# Patient Record
Sex: Male | Born: 1970
Health system: Southern US, Community
[De-identification: ages and names within clinical notes are randomized; demographics above are authoritative.]

## PROBLEM LIST (undated history)

## (undated) DIAGNOSIS — Z8619 Personal history of other infectious and parasitic diseases: Secondary | ICD-10-CM

## (undated) DIAGNOSIS — E1165 Type 2 diabetes mellitus with hyperglycemia: Secondary | ICD-10-CM

## (undated) DIAGNOSIS — Z87442 Personal history of urinary calculi: Secondary | ICD-10-CM

## (undated) DIAGNOSIS — E669 Obesity, unspecified: Secondary | ICD-10-CM

## (undated) DIAGNOSIS — L409 Psoriasis, unspecified: Secondary | ICD-10-CM

## (undated) DIAGNOSIS — Z86010 Personal history of colonic polyps: Secondary | ICD-10-CM

## (undated) DIAGNOSIS — N200 Calculus of kidney: Secondary | ICD-10-CM

## (undated) DIAGNOSIS — E785 Hyperlipidemia, unspecified: Secondary | ICD-10-CM

## (undated) DIAGNOSIS — I10 Essential (primary) hypertension: Secondary | ICD-10-CM

## (undated) HISTORY — DX: Hyperlipidemia, unspecified: E78.5

## (undated) HISTORY — DX: Calculus of kidney: N20.0

## (undated) HISTORY — DX: Essential (primary) hypertension: I10

## (undated) HISTORY — DX: Psoriasis, unspecified: L40.9

## (undated) HISTORY — DX: Personal history of colonic polyps: Z86.010

## (undated) HISTORY — DX: Personal history of urinary calculi: Z87.442

## (undated) HISTORY — DX: Obesity, unspecified: E66.9

## (undated) HISTORY — DX: Type 2 diabetes mellitus with hyperglycemia: E11.65

## (undated) HISTORY — DX: Personal history of other infectious and parasitic diseases: Z86.19

---

## 1994-07-12 DIAGNOSIS — Z87442 Personal history of urinary calculi: Secondary | ICD-10-CM

## 1994-07-12 HISTORY — DX: Personal history of urinary calculi: Z87.442

## 2008-07-12 HISTORY — PX: COLONOSCOPY: SHX174

## 2015-03-21 ENCOUNTER — Ambulatory Visit (INDEPENDENT_AMBULATORY_CARE_PROVIDER_SITE_OTHER): Payer: BLUE CROSS/BLUE SHIELD | Admitting: Family Medicine

## 2015-03-21 ENCOUNTER — Encounter: Payer: Self-pay | Admitting: Family Medicine

## 2015-03-21 VITALS — BP 150/100 | HR 72 | Temp 98.1°F | Ht 71.75 in | Wt 255.8 lb

## 2015-03-21 DIAGNOSIS — R030 Elevated blood-pressure reading, without diagnosis of hypertension: Secondary | ICD-10-CM | POA: Insufficient documentation

## 2015-03-21 DIAGNOSIS — Z0000 Encounter for general adult medical examination without abnormal findings: Secondary | ICD-10-CM | POA: Insufficient documentation

## 2015-03-21 DIAGNOSIS — E669 Obesity, unspecified: Secondary | ICD-10-CM | POA: Insufficient documentation

## 2015-03-21 DIAGNOSIS — IMO0001 Reserved for inherently not codable concepts without codable children: Secondary | ICD-10-CM

## 2015-03-21 DIAGNOSIS — L409 Psoriasis, unspecified: Secondary | ICD-10-CM

## 2015-03-21 HISTORY — DX: Obesity, unspecified: E66.9

## 2015-03-21 MED ORDER — CLOBETASOL PROPIONATE 0.05 % EX CREA: 1.0000 "application " | TOPICAL_CREAM | Freq: Two times a day (BID) | CUTANEOUS | Status: DC

## 2015-03-21 NOTE — Progress Notes (Signed)
BP 150/100 mmHg  Pulse 72  Temp(Src) 98.1 F (36.7 C) (Oral)  Ht 5' 11.75" (1.822 m)  Wt 255 lb 12 oz (116.007 kg)  BMI 34.95 kg/m2   CC: new pt to establish care  Subjective:    Patient ID: Dennis Velasquez, male    DOB: 07-09-1971, 44 y.o.   MRN: [id number]  HPI: Dennis Velasquez is a 44 y.o. male presenting on 03/21/2015 for Establish Care   Psoriasis in legs - controls with lotions and tar. Requests steroid cream which has helped. Also some behind ears - uses nizoral cream for this.   No h/o hypertension  Preventative: Colon cancer screening - at [facility] for fmhx overdue for f/u (~2010).  Flu declines Tdap 2010 Seat belt use discussed Sunscreen use discussed. No changing moles on skin.   Lives with wife and son Occ: Real Estate investment/development Edu: college Activity: some swimming Diet: good water, fruits/vegetables daily  Relevant past medical, surgical, family and social history reviewed and updated as indicated. Interim medical history since our last visit reviewed. Allergies and medications reviewed and updated. No current outpatient prescriptions on file prior to visit.   No current facility-administered medications on file prior to visit.    Review of Systems  Constitutional: Negative for fever, chills, activity change, appetite change, fatigue and unexpected weight change.  HENT: Negative for hearing loss.   Eyes: Negative for visual disturbance.  Respiratory: Negative for cough, chest tightness, shortness of breath and wheezing.   Cardiovascular: Negative for chest pain, palpitations and leg swelling.  Gastrointestinal: Negative for nausea, vomiting, abdominal pain, diarrhea, constipation, blood in stool and abdominal distention.  Genitourinary: Negative for hematuria and difficulty urinating.  Musculoskeletal: Negative for myalgias, arthralgias and neck pain.  Skin: Negative for rash.  Neurological: Negative for dizziness, seizures, syncope and headaches.    Hematological: Negative for adenopathy. Does not bruise/bleed easily.  Psychiatric/Behavioral: Negative for dysphoric mood. The patient is not nervous/anxious.    Per HPI unless specifically indicated above     Objective:    BP 150/100 mmHg  Pulse 72  Temp(Src) 98.1 F (36.7 C) (Oral)  Ht 5' 11.75" (1.822 m)  Wt 255 lb 12 oz (116.007 kg)  BMI 34.95 kg/m2  Wt Readings from Last 3 Encounters:  03/21/15 255 lb 12 oz (116.007 kg)    Physical Exam  Constitutional: He is oriented to person, place, and time. He appears well-developed and well-nourished. No distress.  HENT:  Head: Normocephalic and atraumatic.  Right Ear: Hearing, tympanic membrane, external ear and ear canal normal.  Left Ear: Hearing, tympanic membrane, external ear and ear canal normal.  Nose: Nose normal.  Mouth/Throat: Uvula is midline, oropharynx is clear and moist and mucous membranes are normal. No oropharyngeal exudate, posterior oropharyngeal edema or posterior oropharyngeal erythema.  Eyes: Conjunctivae and EOM are normal. Pupils are equal, round, and reactive to light. No scleral icterus.  Neck: Normal range of motion. Neck supple. No thyromegaly present.  Cardiovascular: Normal rate, regular rhythm, normal heart sounds and intact distal pulses.   No murmur heard. Pulses:      Radial pulses are 2+ on the right side, and 2+ on the left side.  Pulmonary/Chest: Effort normal and breath sounds normal. No respiratory distress. He has no wheezes. He has no rales.  Abdominal: Soft. Bowel sounds are normal. He exhibits no distension and no mass. There is no tenderness. There is no rebound and no guarding.  Musculoskeletal: Normal range of motion. He exhibits  no edema.  Lymphadenopathy:    He has no cervical adenopathy.  Neurological: He is alert and oriented to person, place, and time.  CN grossly intact, station and gait intact  Skin: Skin is warm and dry. No rash noted.  Psychiatric: He has a normal mood and  affect. His behavior is normal. Judgment and thought content normal.  Nursing note and vitals reviewed.  No results found for this or any previous visit.    Assessment & Plan:   Problem List Items Addressed This Visit    Health maintenance examination - Primary    Preventative protocols reviewed and updated unless pt declined. Discussed healthy diet and lifestyle.       Psoriasis    Refilled clobetasol per pt request.      Elevated blood pressure    Without h/o HTN. No significant fmhx HTN. Discussed this - pt will monitor bp at home, work on healthy diet and lifestyle changes (see handout provided today) and return in 1-2 mo for bp recheck      Relevant Orders   Lipid panel   Comprehensive metabolic panel   TSH   Obesity, Class I, BMI 30-34.9    Discussed healthy diet and lifestyle changes to affect sustainable weight loss.      Relevant Orders   Lipid panel   Comprehensive metabolic panel   TSH       Follow up plan: Return in about 1 year (around 03/20/2016), or as needed, for annual exam, prior fasting for blood work.

## 2015-03-21 NOTE — Progress Notes (Signed)
Pre visit review using our clinic review tool, if applicable. No additional management support is needed unless otherwise documented below in the visit note. 

## 2015-03-21 NOTE — Assessment & Plan Note (Signed)
Without h/o HTN. No significant fmhx HTN. Discussed this - pt will monitor bp at home, work on healthy diet and lifestyle changes (see handout provided today) and return in 1-2 mo for bp recheck

## 2015-03-21 NOTE — Assessment & Plan Note (Signed)
Discussed healthy diet and lifestyle changes to affect sustainable weight loss  

## 2015-03-21 NOTE — Assessment & Plan Note (Signed)
Preventative protocols reviewed and updated unless pt declined. Discussed healthy diet and lifestyle.  

## 2015-03-21 NOTE — Assessment & Plan Note (Signed)
Refilled clobetasol per pt request.

## 2015-03-21 NOTE — Patient Instructions (Addendum)
Return at your convenience next week for fasting labs Call to schedule colonoscopy Your goal blood pressure is <140/90. Work on low salt/sodium diet - goal <1.5gm (1,500mg ) per day. Eat a diet high in fruits/vegetables and whole grains.  Look into mediterranean and DASH diet. Goal activity is 164min/wk of moderate intensity exercise.  This can be split into 30 minute chunks.  If you are not at this level, you can start with smaller 10-15 min increments and slowly build up activity. Look at [place].org for more resources Look into [employer]. Return in 1-2 months to recheck blood pressure  Health Maintenance A healthy lifestyle and preventative care can promote health and wellness.  Maintain regular health, dental, and eye exams.  Eat a healthy diet. Foods like vegetables, fruits, whole grains, low-fat dairy products, and lean protein foods contain the nutrients you need and are low in calories. Decrease your intake of foods high in solid fats, added sugars, and salt. Get information about a proper diet from your health care provider, if necessary.  Regular physical exercise is one of the most important things you can do for your health. Most adults should get at least 150 minutes of moderate-intensity exercise (any activity that increases your heart rate and causes you to sweat) each week. In addition, most adults need muscle-strengthening exercises on 2 or more days a week.   Maintain a healthy weight. The body mass index (BMI) is a screening tool to identify possible weight problems. It provides an estimate of body fat based on height and weight. Your health care provider can find your BMI and can help you achieve or maintain a healthy weight. For males 20 years and older:  A BMI below 18.5 is considered underweight.  A BMI of 18.5 to 24.9 is normal.  A BMI of 25 to 29.9 is considered overweight.  A BMI of 30 and above is considered obese.  Maintain normal blood lipids and  cholesterol by exercising and minimizing your intake of saturated fat. Eat a balanced diet with plenty of fruits and vegetables. Blood tests for lipids and cholesterol should begin at age 4 and be repeated every 5 years. If your lipid or cholesterol levels are high, you are over age 21, or you are at high risk for heart disease, you may need your cholesterol levels checked more frequently.Ongoing high lipid and cholesterol levels should be treated with medicines if diet and exercise are not working.  If you smoke, find out from your health care provider how to quit. If you do not use tobacco, do not start.  Lung cancer screening is recommended for adults aged 60-80 years who are at high risk for developing lung cancer because of a history of smoking. A yearly low-dose CT scan of the lungs is recommended for people who have at least a 30-pack-year history of smoking and are current smokers or have quit within the past 15 years. A pack year of smoking is smoking an average of 1 pack of cigarettes a day for 1 year (for example, a 30-pack-year history of smoking could mean smoking 1 pack a day for 30 years or 2 packs a day for 15 years). Yearly screening should continue until the smoker has stopped smoking for at least 15 years. Yearly screening should be stopped for people who develop a health problem that would prevent them from having lung cancer treatment.  If you choose to drink alcohol, do not have more than 2 drinks per day. One drink is  considered to be 12 oz (360 mL) of beer, 5 oz (150 mL) of wine, or 1.5 oz (45 mL) of liquor.  Avoid the use of street drugs. Do not share needles with anyone. Ask for help if you need support or instructions about stopping the use of drugs.  High blood pressure causes heart disease and increases the risk of stroke. Blood pressure should be checked at least every 1-2 years. Ongoing high blood pressure should be treated with medicines if weight loss and exercise are not  effective.  If you are 49-31 years old, ask your health care provider if you should take aspirin to prevent heart disease.  Diabetes screening involves taking a blood sample to check your fasting blood sugar level. This should be done once every 3 years after age 27 if you are at a normal weight and without risk factors for diabetes. Testing should be considered at a younger age or be carried out more frequently if you are overweight and have at least 1 risk factor for diabetes.  Colorectal cancer can be detected and often prevented. Most routine colorectal cancer screening begins at the age of 79 and continues through age 82. However, your health care provider may recommend screening at an earlier age if you have risk factors for colon cancer. On a yearly basis, your health care provider may provide home test kits to check for hidden blood in the stool. A small camera at the end of a tube may be used to directly examine the colon (sigmoidoscopy or colonoscopy) to detect the earliest forms of colorectal cancer. Talk to your health care provider about this at age 76 when routine screening begins. A direct exam of the colon should be repeated every 5-10 years through age 59, unless early forms of precancerous polyps or small growths are found.  People who are at an increased risk for hepatitis B should be screened for this virus. You are considered at high risk for hepatitis B if:  You were born in a country where hepatitis B occurs often. Talk with your health care provider about which countries are considered high risk.  Your parents were born in a high-risk country and you have not received a shot to protect against hepatitis B (hepatitis B vaccine).  You have HIV or AIDS.  You use needles to inject street drugs.  You live with, or have sex with, someone who has hepatitis B.  You are a man who has sex with other men (MSM).  You get hemodialysis treatment.  You take certain medicines for  conditions like cancer, organ transplantation, and autoimmune conditions.  Hepatitis C blood testing is recommended for all people born from [birth year] through 1965 and any individual with known risk factors for hepatitis C.  Healthy men should no longer receive prostate-specific antigen (PSA) blood tests as part of routine cancer screening. Talk to your health care provider about prostate cancer screening.  Testicular cancer screening is not recommended for adolescents or adult males who have no symptoms. Screening includes self-exam, a health care provider exam, and other screening tests. Consult with your health care provider about any symptoms you have or any concerns you have about testicular cancer.  Practice safe sex. Use condoms and avoid high-risk sexual practices to reduce the spread of sexually transmitted infections (STIs).  You should be screened for STIs, including gonorrhea and chlamydia if:  You are sexually active and are younger than 24 years.  You are older than 24 years, and your  health care provider tells you that you are at risk for this type of infection.  Your sexual activity has changed since you were last screened, and you are at an increased risk for chlamydia or gonorrhea. Ask your health care provider if you are at risk.  If you are at risk of being infected with HIV, it is recommended that you take a prescription medicine daily to prevent HIV infection. This is called pre-exposure prophylaxis (PrEP). You are considered at risk if:  You are a man who has sex with other men (MSM).  You are a heterosexual man who is sexually active with multiple partners.  You take drugs by injection.  You are sexually active with a partner who has HIV.  Talk with your health care provider about whether you are at high risk of being infected with HIV. If you choose to begin PrEP, you should first be tested for HIV. You should then be tested every 3 months for as long as you are taking  PrEP.  Use sunscreen. Apply sunscreen liberally and repeatedly throughout the day. You should seek shade when your shadow is shorter than you. Protect yourself by wearing long sleeves, pants, a wide-brimmed hat, and sunglasses year round whenever you are outdoors.  Tell your health care provider of new moles or changes in moles, especially if there is a change in shape or color. Also, tell your health care provider if a mole is larger than the size of a pencil eraser.  A one-time screening for abdominal aortic aneurysm (AAA) and surgical repair of large AAAs by ultrasound is recommended for men aged 78-75 years who are current or former smokers.  Stay current with your vaccines (immunizations). Document Released: 12/25/2007 Document Revised: 07/03/2013 Document Reviewed: 11/23/2010 [facility] Patient Information 2015 [place], [place]. This information is not intended to replace advice given to you by your health care provider. Make sure you discuss any questions you have with your health care provider.

## 2015-03-26 ENCOUNTER — Other Ambulatory Visit (INDEPENDENT_AMBULATORY_CARE_PROVIDER_SITE_OTHER): Payer: BLUE CROSS/BLUE SHIELD

## 2015-03-26 DIAGNOSIS — R030 Elevated blood-pressure reading, without diagnosis of hypertension: Secondary | ICD-10-CM

## 2015-03-26 DIAGNOSIS — E669 Obesity, unspecified: Secondary | ICD-10-CM

## 2015-03-26 DIAGNOSIS — IMO0001 Reserved for inherently not codable concepts without codable children: Secondary | ICD-10-CM

## 2015-03-26 LAB — LIPID PANEL
Cholesterol: 163 mg/dL (ref 0–200)
HDL: 29.2 mg/dL — AB (ref >39.00)
LDL Cholesterol: 99 mg/dL (ref 0–99)
NONHDL: 133.85
TRIGLYCERIDES: 175 mg/dL — AB (ref 0.0–149.0)
Total CHOL/HDL Ratio: 6
VLDL: 35 mg/dL (ref 0.0–40.0)

## 2015-03-26 LAB — COMPREHENSIVE METABOLIC PANEL
ALBUMIN: 3.9 g/dL (ref 3.5–5.2)
ALK PHOS: 79 U/L (ref 39–117)
ALT: 99 U/L — AB (ref 0–53)
AST: 38 U/L — ABNORMAL HIGH (ref 0–37)
BILIRUBIN TOTAL: 0.7 mg/dL (ref 0.2–1.2)
BUN: 11 mg/dL (ref 6–23)
CALCIUM: 9.1 mg/dL (ref 8.4–10.5)
CO2: 26 mEq/L (ref 19–32)
Chloride: 103 mEq/L (ref 96–112)
Creatinine, Ser: 0.79 mg/dL (ref 0.40–1.50)
GFR: 113.24 mL/min (ref >60.00)
GLUCOSE: 233 mg/dL — AB (ref 70–99)
POTASSIUM: 4.4 meq/L (ref 3.5–5.1)
Sodium: 136 mEq/L (ref 135–145)
TOTAL PROTEIN: 6.9 g/dL (ref 6.0–8.3)

## 2015-03-26 LAB — TSH: TSH: 4.11 u[IU]/mL (ref 0.35–4.50)

## 2015-04-21 ENCOUNTER — Other Ambulatory Visit: Payer: Self-pay | Admitting: Family Medicine

## 2015-04-21 DIAGNOSIS — R7401 Elevation of levels of liver transaminase levels: Secondary | ICD-10-CM | POA: Insufficient documentation

## 2015-04-21 DIAGNOSIS — R739 Hyperglycemia, unspecified: Secondary | ICD-10-CM

## 2015-04-21 DIAGNOSIS — R740 Nonspecific elevation of levels of transaminase and lactic acid dehydrogenase [LDH]: Secondary | ICD-10-CM

## 2015-04-22 ENCOUNTER — Other Ambulatory Visit (INDEPENDENT_AMBULATORY_CARE_PROVIDER_SITE_OTHER): Payer: BLUE CROSS/BLUE SHIELD

## 2015-04-22 DIAGNOSIS — R740 Nonspecific elevation of levels of transaminase and lactic acid dehydrogenase [LDH]: Secondary | ICD-10-CM | POA: Diagnosis not present

## 2015-04-22 DIAGNOSIS — R7401 Elevation of levels of liver transaminase levels: Secondary | ICD-10-CM

## 2015-04-22 DIAGNOSIS — R739 Hyperglycemia, unspecified: Secondary | ICD-10-CM | POA: Diagnosis not present

## 2015-04-22 LAB — COMPREHENSIVE METABOLIC PANEL
ALT: 71 U/L — AB (ref 0–53)
AST: 29 U/L (ref 0–37)
Albumin: 3.8 g/dL (ref 3.5–5.2)
Alkaline Phosphatase: 77 U/L (ref 39–117)
BILIRUBIN TOTAL: 0.6 mg/dL (ref 0.2–1.2)
BUN: 12 mg/dL (ref 6–23)
CHLORIDE: 102 meq/L (ref 96–112)
CO2: 27 meq/L (ref 19–32)
Calcium: 9.2 mg/dL (ref 8.4–10.5)
Creatinine, Ser: 0.84 mg/dL (ref 0.40–1.50)
GFR: 105.46 mL/min (ref >60.00)
GLUCOSE: 176 mg/dL — AB (ref 70–99)
Potassium: 4.4 mEq/L (ref 3.5–5.1)
Sodium: 137 mEq/L (ref 135–145)
Total Protein: 6.8 g/dL (ref 6.0–8.3)

## 2015-04-22 LAB — HEMOGLOBIN A1C: HEMOGLOBIN A1C: 8.5 % — AB (ref 4.6–6.5)

## 2015-04-22 NOTE — Addendum Note (Signed)
Addended by: Ellamae Sia on: 04/22/2015 09:09 AM   Modules accepted: Orders

## 2015-04-23 LAB — MICROALBUMIN / CREATININE URINE RATIO
CREATININE, U: 157.8 mg/dL
MICROALB/CREAT RATIO: 0.6 mg/g (ref 0.0–30.0)
Microalb, Ur: 0.9 mg/dL (ref 0.0–1.9)

## 2015-04-23 NOTE — Addendum Note (Signed)
Addended by: Marchia Bond on: 04/23/2015 01:51 PM   Modules accepted: Orders

## 2015-04-25 ENCOUNTER — Ambulatory Visit (INDEPENDENT_AMBULATORY_CARE_PROVIDER_SITE_OTHER): Payer: BLUE CROSS/BLUE SHIELD | Admitting: Family Medicine

## 2015-04-25 ENCOUNTER — Encounter: Payer: Self-pay | Admitting: Family Medicine

## 2015-04-25 VITALS — BP 154/98 | HR 80 | Temp 98.0°F | Wt 259.5 lb

## 2015-04-25 DIAGNOSIS — I10 Essential (primary) hypertension: Secondary | ICD-10-CM | POA: Diagnosis not present

## 2015-04-25 DIAGNOSIS — E669 Obesity, unspecified: Secondary | ICD-10-CM

## 2015-04-25 DIAGNOSIS — E1165 Type 2 diabetes mellitus with hyperglycemia: Secondary | ICD-10-CM | POA: Diagnosis not present

## 2015-04-25 DIAGNOSIS — IMO0001 Reserved for inherently not codable concepts without codable children: Secondary | ICD-10-CM

## 2015-04-25 HISTORY — DX: Reserved for inherently not codable concepts without codable children: IMO0001

## 2015-04-25 MED ORDER — METFORMIN HCL 500 MG PO TABS: ORAL_TABLET | ORAL | Status: AC

## 2015-04-25 MED ORDER — LISINOPRIL 10 MG PO TABS: 10.0000 mg | ORAL_TABLET | Freq: Every day | ORAL | Status: DC

## 2015-04-25 NOTE — Progress Notes (Signed)
Pre visit review using our clinic review tool, if applicable. No additional management support is needed unless otherwise documented below in the visit note. 

## 2015-04-25 NOTE — Assessment & Plan Note (Signed)
Discussed importance of weight loss to help control blood pressure and sugars.

## 2015-04-25 NOTE — Assessment & Plan Note (Addendum)
Second elevated reading - will check EKG and start lisinopril 10mg  daily. Advised pt return in 1-2 wks for Cr check. RTC 6 wks recheck DM/HTN.  EKG NSR rate 70, normal axis, intervals, no acute ST/T changes

## 2015-04-25 NOTE — Progress Notes (Signed)
BP 154/98 mmHg  Pulse 80  Temp(Src) 98 F (36.7 C) (Oral)  Wt 259 lb 8 oz (117.708 kg)   CC: bp check  Subjective:    Patient ID: Dennis Velasquez, male    DOB: Sep 13, 1970, 44 y.o.   MRN: [id number]  HPI: Dennis Velasquez is a 44 y.o. male presenting on 04/25/2015 for Blood Pressure Check   Established with [place] last month, bp elevated at that time. We discussed healthy diet and lifestyle changes and I asked him to return today for f/u. Checks at home and diastolic ran 31-51. Doesn't remember systolic.   Obesity - 4lb weight gain noted. 25lb weight gain noted over last 2 yrs.  Diabetes - sugar elevated, so on recheck A1c returned 8.5%.   Relevant past medical, surgical, family and social history reviewed and updated as indicated. Interim medical history since our last visit reviewed. Allergies and medications reviewed and updated. No current outpatient prescriptions on file prior to visit.   No current facility-administered medications on file prior to visit.    Review of Systems Per HPI unless specifically indicated above     Objective:    BP 154/98 mmHg  Pulse 80  Temp(Src) 98 F (36.7 C) (Oral)  Wt 259 lb 8 oz (117.708 kg)  Wt Readings from Last 3 Encounters:  04/25/15 259 lb 8 oz (117.708 kg)  03/21/15 255 lb 12 oz (116.007 kg)   Body mass index is 35.46 kg/(m^2).  Physical Exam  Constitutional: He appears well-developed and well-nourished. No distress.  Psychiatric: He has a normal mood and affect.  Nursing note and vitals reviewed.  Results for orders placed or performed in visit on 04/22/15  Comprehensive metabolic panel  Result Value Ref Range   Sodium 137 135 - 145 mEq/L   Potassium 4.4 3.5 - 5.1 mEq/L   Chloride 102 96 - 112 mEq/L   CO2 27 19 - 32 mEq/L   Glucose, Bld 176 (H) 70 - 99 mg/dL   BUN 12 6 - 23 mg/dL   Creatinine, Ser 0.84 0.40 - 1.50 mg/dL   Total Bilirubin 0.6 0.2 - 1.2 mg/dL   Alkaline Phosphatase 77 39 - 117 U/L   AST 29 0 - 37 U/L   ALT  71 (H) 0 - 53 U/L   Total Protein 6.8 6.0 - 8.3 g/dL   Albumin 3.8 3.5 - 5.2 g/dL   Calcium 9.2 8.4 - 10.5 mg/dL   GFR 105.46 >60.00 mL/min  Hemoglobin A1c  Result Value Ref Range   Hgb A1c MFr Bld 8.5 (H) 4.6 - 6.5 %  Microalbumin / creatinine urine ratio  Result Value Ref Range   Microalb, Ur 0.9 0.0 - 1.9 mg/dL   Creatinine,U 157.8 mg/dL   Microalb Creat Ratio 0.6 0.0 - 30.0 mg/g      Assessment & Plan:  Over 25 minutes were spent face-to-face with the patient during this encounter and >50% of that time was spent on counseling and coordination of care  Problem List Items Addressed This Visit    Obesity, Class I, BMI 30-34.9    Discussed importance of weight loss to help control blood pressure and sugars.      Relevant Medications   metFORMIN (GLUCOPHAGE) 500 MG tablet   Essential hypertension    Second elevated reading - will check EKG and start lisinopril 10mg  daily. Advised pt return in 1-2 wks for Cr check. RTC 6 wks recheck DM/HTN.  EKG NSR rate 70, normal axis, intervals, no  acute ST/T changes      Relevant Medications   lisinopril (PRINIVIL,ZESTRIL) 10 MG tablet   Other Relevant Orders   EKG 12-Lead (Completed)   Creatine   Diabetes mellitus type 2, uncontrolled, without complications ([place]) - Primary    New diagnosis based on A1c of 8.5%. Discussed pathophysiology of diabetes as well as management issues. Pt motivated for weight loss and healthy diet/lifestyle changes. Did recommend we start metformin 500mg  with quick taper to BID dosing.  RTC 6 wks recheck DM/HTN.      Relevant Medications   metFORMIN (GLUCOPHAGE) 500 MG tablet   lisinopril (PRINIVIL,ZESTRIL) 10 MG tablet       Follow up plan: Return in about 6 weeks (around 06/06/2015), or as needed, for follow up visit.

## 2015-04-25 NOTE — Patient Instructions (Addendum)
EKG today.  Think about diabetes education classes.  Increase regular activity in your routine.  Back off simple carbs and added sugars. Start metformin 500mg  daily for 1 week then increase to 500mg  twice daily. Start lisinopril 10mg  daily for blood pressure. Goal <140/90. If consistently <110 or if you're feeling dizzy let me know.  Look at diabetes.org for more resources (ADA website). Return to see me in 6 weeks for follow up (before thanksgiving)  Diabetes Mellitus and Food It is important for you to manage your blood sugar (glucose) level. Your blood glucose level can be greatly affected by what you eat. Eating healthier foods in the appropriate amounts throughout the day at about the same time each day will help you control your blood glucose level. It can also help slow or prevent worsening of your diabetes mellitus. Healthy eating may even help you improve the level of your blood pressure and reach or maintain a healthy weight.  General recommendations for healthful eating and cooking habits include:  Eating meals and snacks regularly. Avoid going long periods of time without eating to lose weight.  Eating a diet that consists mainly of plant-based foods, such as fruits, vegetables, nuts, legumes, and whole grains.  Using low-heat cooking methods, such as baking, instead of high-heat cooking methods, such as deep frying. Work with your dietitian to make sure you understand how to use the Nutrition Facts information on food labels. HOW CAN FOOD AFFECT ME? Carbohydrates Carbohydrates affect your blood glucose level more than any other type of food. Your dietitian will help you determine how many carbohydrates to eat at each meal and teach you how to count carbohydrates. Counting carbohydrates is important to keep your blood glucose at a healthy level, especially if you are using insulin or taking certain medicines for diabetes mellitus. Alcohol Alcohol can cause sudden decreases in blood  glucose (hypoglycemia), especially if you use insulin or take certain medicines for diabetes mellitus. Hypoglycemia can be a life-threatening condition. Symptoms of hypoglycemia (sleepiness, dizziness, and disorientation) are similar to symptoms of having too much alcohol.  If your health care provider has given you approval to drink alcohol, do so in moderation and use the following guidelines:  Women should not have more than one drink per day, and men should not have more than two drinks per day. One drink is equal to:  12 oz of beer.  5 oz of wine.  1 oz of hard liquor.  Do not drink on an empty stomach.  Keep yourself hydrated. Have water, diet soda, or unsweetened iced tea.  Regular soda, juice, and other mixers might contain a lot of carbohydrates and should be counted. WHAT FOODS ARE NOT RECOMMENDED? As you make food choices, it is important to remember that all foods are not the same. Some foods have fewer nutrients per serving than other foods, even though they might have the same number of calories or carbohydrates. It is difficult to get your body what it needs when you eat foods with fewer nutrients. Examples of foods that you should avoid that are high in calories and carbohydrates but low in nutrients include:  Trans fats (most processed foods list trans fats on the Nutrition Facts label).  Regular soda.  Juice.  Candy.  Sweets, such as cake, pie, doughnuts, and cookies.  Fried foods. WHAT FOODS CAN I EAT? Eat nutrient-rich foods, which will nourish your body and keep you healthy. The food you should eat also will depend on several factors, including:  The calories you need.  The medicines you take.  Your weight.  Your blood glucose level.  Your blood pressure level.  Your cholesterol level. You should eat a variety of foods, including:  Protein.  Lean cuts of meat.  Proteins low in saturated fats, such as fish, egg whites, and beans. Avoid processed  meats.  Fruits and vegetables.  Fruits and vegetables that may help control blood glucose levels, such as apples, mangoes, and yams.  Dairy products.  Choose fat-free or low-fat dairy products, such as milk, yogurt, and cheese.  Grains, bread, pasta, and rice.  Choose whole grain products, such as multigrain bread, whole oats, and brown rice. These foods may help control blood pressure.  Fats.  Foods containing healthful fats, such as nuts, avocado, olive oil, canola oil, and fish. DOES EVERYONE WITH DIABETES MELLITUS HAVE THE SAME MEAL PLAN? Because every person with diabetes mellitus is different, there is not one meal plan that works for everyone. It is very important that you meet with a dietitian who will help you create a meal plan that is just right for you.   This information is not intended to replace advice given to you by your health care provider. Make sure you discuss any questions you have with your health care provider.   Document Released: 03/25/2005 Document Revised: 07/19/2014 Document Reviewed: 05/25/2013 Elsevier Interactive Patient Education [employer].

## 2015-04-25 NOTE — Assessment & Plan Note (Signed)
New diagnosis based on A1c of 8.5%. Discussed pathophysiology of diabetes as well as management issues. Pt motivated for weight loss and healthy diet/lifestyle changes. Did recommend we start metformin 500mg  with quick taper to BID dosing.  RTC 6 wks recheck DM/HTN.

## 2015-04-28 ENCOUNTER — Encounter: Payer: Self-pay | Admitting: *Deleted

## 2015-05-12 ENCOUNTER — Other Ambulatory Visit: Payer: Self-pay | Admitting: Family Medicine

## 2015-05-12 ENCOUNTER — Other Ambulatory Visit (INDEPENDENT_AMBULATORY_CARE_PROVIDER_SITE_OTHER): Payer: BLUE CROSS/BLUE SHIELD

## 2015-05-12 ENCOUNTER — Other Ambulatory Visit: Payer: BLUE CROSS/BLUE SHIELD

## 2015-05-12 DIAGNOSIS — I10 Essential (primary) hypertension: Secondary | ICD-10-CM

## 2015-05-14 LAB — CREATINE

## 2015-05-15 ENCOUNTER — Telehealth: Payer: Self-pay | Admitting: *Deleted

## 2015-05-15 DIAGNOSIS — I10 Essential (primary) hypertension: Secondary | ICD-10-CM

## 2015-05-15 NOTE — Telephone Encounter (Signed)
Received a call from [facility] lab that they do not have the reagent needed to perform the creatine test, and it would take 2 weeks to get it in. They called for authorization to send the specimen to another lab that is able to perform the test. I gave the authorization to send the specimen to the other lab. Results should be back within 1 week (there is a 10 day TAT for results anyway).

## 2015-05-15 NOTE — Telephone Encounter (Signed)
This was my mistake - I wanted creatinine not creatine - can we cancel creatine order and order serum Cr? Thanks.

## 2015-05-16 NOTE — Telephone Encounter (Signed)
Yes, I believe Karna Christmas took care of this yesterday.

## 2015-05-23 LAB — CREATININE, SERUM

## 2015-05-30 ENCOUNTER — Encounter: Payer: Self-pay | Admitting: Radiology

## 2015-06-26 ENCOUNTER — Encounter: Payer: Self-pay | Admitting: Family Medicine

## 2015-06-26 ENCOUNTER — Ambulatory Visit (INDEPENDENT_AMBULATORY_CARE_PROVIDER_SITE_OTHER): Payer: BLUE CROSS/BLUE SHIELD | Admitting: Family Medicine

## 2015-06-26 VITALS — BP 128/78 | HR 75 | Temp 98.1°F | Wt 254.0 lb

## 2015-06-26 DIAGNOSIS — R030 Elevated blood-pressure reading, without diagnosis of hypertension: Secondary | ICD-10-CM | POA: Diagnosis not present

## 2015-06-26 DIAGNOSIS — L409 Psoriasis, unspecified: Secondary | ICD-10-CM

## 2015-06-26 DIAGNOSIS — E1165 Type 2 diabetes mellitus with hyperglycemia: Secondary | ICD-10-CM | POA: Diagnosis not present

## 2015-06-26 DIAGNOSIS — IMO0001 Reserved for inherently not codable concepts without codable children: Secondary | ICD-10-CM

## 2015-06-26 DIAGNOSIS — E669 Obesity, unspecified: Secondary | ICD-10-CM

## 2015-06-26 LAB — POCT CBG (FASTING - GLUCOSE)-MANUAL ENTRY: Glucose Fasting, POC: 75 mg/dL (ref 70–99)

## 2015-06-26 NOTE — Assessment & Plan Note (Signed)
Anticipate improved control based on better diet and compliance with metformin. Foot exam today  Random CBG today. rec check with insurance about preferred glucose meter to send home.

## 2015-06-26 NOTE — Assessment & Plan Note (Signed)
Clobetasol not effective. Afraid of biological agents

## 2015-06-26 NOTE — Progress Notes (Signed)
BP 128/78 mmHg  Pulse 75  Temp(Src) 98.1 F (36.7 C) (Oral)  Wt 254 lb (115.214 kg)  SpO2 95%   CC: 44mo f/u visit  Subjective:    Patient ID: Dennis Velasquez, male    DOB: [birth year], 44 y.o.   MRN: [id number]  HPI: FRITZ LICEA is a 44 y.o. male presenting on 06/26/2015 for Follow-up   Elevated blood pressure - never started lisinopril. Changed diet, decreased salt intake, increased exercise. Has lost 10 lbs on home scale. Does check blood pressures at home: better control at home as well.    DM - regularly does not check sugars - doesn't have glucometer.  Compliant with antihyperglycemic regimen which includes: metformin 500mg  bid. Tolerating well. Denies low sugars or hypoglycemic symptoms. Denies paresthesias. Last diabetic eye exam DUE. Pneumovax: DUE. Prevnar: not due. Has cut all sugar, bread, potatoes, more plant based proteins. More active but no regimented exercise yet. Last meal today hamburger at 12:30pm.  Lab Results  Component Value Date   HGBA1C 8.5* 04/22/2015   Diabetic Foot Exam - Simple   Simple Foot Form  Diabetic Foot exam was performed with the following findings:  Yes 06/26/2015  5:18 PM  Visual Inspection  No deformities, no ulcerations, no other skin breakdown bilaterally:  Yes  Sensation Testing  Intact to touch and monofilament testing bilaterally:  Yes  Pulse Check  Posterior Tibialis and Dorsalis pulse intact bilaterally:  Yes  Comments       Awaiting new eliptical machine at the end of this month. Doesn't like to use treadmill.   Psoriasis - clobetasol cream very expensive and worsened rash.   Relevant past medical, surgical, family and social history reviewed and updated as indicated. Interim medical history since our last visit reviewed. Allergies and medications reviewed and updated. Current Outpatient Prescriptions on File Prior to Visit  Medication Sig  . metFORMIN (GLUCOPHAGE) 500 MG tablet Take one daily for 1 week then increase to  twice daily with meals   No current facility-administered medications on file prior to visit.    Review of Systems Per HPI unless specifically indicated in ROS section     Objective:    BP 128/78 mmHg  Pulse 75  Temp(Src) 98.1 F (36.7 C) (Oral)  Wt 254 lb (115.214 kg)  SpO2 95%  Wt Readings from Last 3 Encounters:  06/26/15 254 lb (115.214 kg)  04/25/15 259 lb 8 oz (117.708 kg)  03/21/15 255 lb 12 oz (116.007 kg)   Body mass index is 34.71 kg/(m^2).  Physical Exam  Constitutional: He appears well-developed and well-nourished. No distress.  HENT:  Head: Normocephalic and atraumatic.  Right Ear: External ear normal.  Left Ear: External ear normal.  Nose: Nose normal.  Mouth/Throat: Oropharynx is clear and moist. No oropharyngeal exudate.  Eyes: Conjunctivae and EOM are normal. Pupils are equal, round, and reactive to light. No scleral icterus.  Neck: Normal range of motion. Neck supple.  Cardiovascular: Normal rate, regular rhythm, normal heart sounds and intact distal pulses.   No murmur heard. Pulmonary/Chest: Effort normal and breath sounds normal. No respiratory distress. He has no wheezes. He has no rales.  Musculoskeletal: He exhibits no edema.  See HPI for foot exam if done  Lymphadenopathy:    He has no cervical adenopathy.  Skin: Skin is warm and dry. No rash noted.  Psychiatric: He has a normal mood and affect.  Nursing note and vitals reviewed.     Assessment & Plan:  Problem List Items Addressed This Visit    Psoriasis    Clobetasol not effective. Afraid of biological agents      Obesity, Class I, BMI 30-34.9    Congratulated on weight loss noted today.       Elevated blood pressure reading without diagnosis of hypertension    Actually bp improved with healthy diet and lifestyle changes. No need for lisinopril currently.      Diabetes mellitus type 2, uncontrolled, without complications ([place]) - Primary    Anticipate improved control based on  better diet and compliance with metformin. Foot exam today  Random CBG today. rec check with insurance about preferred glucose meter to send home.          Follow up plan: Return in about 4 months (around 10/25/2015), or as needed, for follow up visit.

## 2015-06-26 NOTE — Progress Notes (Signed)
Pre visit review using our clinic review tool, if applicable. No additional management support is needed unless otherwise documented below in the visit note. 

## 2015-06-26 NOTE — Patient Instructions (Addendum)
Check with insurance or pharmacy about preferred glucose meter and strips on your formulary and let me know and we will send to pharmacy. Return after 07/23/2015 for lab visit only to recheck A1c. Blood pressure is looking great! Sugar check today.  Return as needed or in 4 months for follow up visit. Good to see you today, call us with quesitons.

## 2015-06-26 NOTE — Assessment & Plan Note (Signed)
Actually bp improved with healthy diet and lifestyle changes. No need for lisinopril currently.

## 2015-06-26 NOTE — Addendum Note (Signed)
Addended by: Josetta Huddle on: 06/26/2015 05:28 PM   Modules accepted: Orders

## 2015-06-26 NOTE — Assessment & Plan Note (Signed)
Congratulated on weight loss noted today.

## 2015-10-23 ENCOUNTER — Ambulatory Visit: Payer: BLUE CROSS/BLUE SHIELD | Admitting: Family Medicine

## 2015-10-23 DIAGNOSIS — Z0289 Encounter for other administrative examinations: Secondary | ICD-10-CM

## 2016-03-20 ENCOUNTER — Other Ambulatory Visit: Payer: Self-pay | Admitting: Family Medicine

## 2016-03-20 ENCOUNTER — Encounter: Payer: Self-pay | Admitting: Family Medicine

## 2016-03-20 DIAGNOSIS — R740 Nonspecific elevation of levels of transaminase and lactic acid dehydrogenase [LDH]: Secondary | ICD-10-CM

## 2016-03-20 DIAGNOSIS — E1165 Type 2 diabetes mellitus with hyperglycemia: Principal | ICD-10-CM

## 2016-03-20 DIAGNOSIS — R7401 Elevation of levels of liver transaminase levels: Secondary | ICD-10-CM

## 2016-03-20 DIAGNOSIS — E669 Obesity, unspecified: Secondary | ICD-10-CM

## 2016-03-20 DIAGNOSIS — E785 Hyperlipidemia, unspecified: Secondary | ICD-10-CM

## 2016-03-20 DIAGNOSIS — R030 Elevated blood-pressure reading, without diagnosis of hypertension: Secondary | ICD-10-CM

## 2016-03-20 DIAGNOSIS — IMO0001 Reserved for inherently not codable concepts without codable children: Secondary | ICD-10-CM

## 2016-03-20 HISTORY — DX: Hyperlipidemia, unspecified: E78.5

## 2016-03-23 ENCOUNTER — Other Ambulatory Visit: Payer: BLUE CROSS/BLUE SHIELD

## 2016-03-30 ENCOUNTER — Encounter: Payer: BLUE CROSS/BLUE SHIELD | Admitting: Family Medicine

## 2016-11-04 ENCOUNTER — Encounter: Payer: Self-pay | Admitting: Internal Medicine

## 2016-12-10 ENCOUNTER — Ambulatory Visit: Payer: BLUE CROSS/BLUE SHIELD | Admitting: Internal Medicine

## 2017-04-26 ENCOUNTER — Other Ambulatory Visit: Payer: Self-pay | Admitting: Internal Medicine

## 2017-04-26 DIAGNOSIS — E785 Hyperlipidemia, unspecified: Secondary | ICD-10-CM

## 2017-05-03 ENCOUNTER — Ambulatory Visit
Admission: RE | Admit: 2017-05-03 | Discharge: 2017-05-03 | Disposition: A | Discharge status: Home / Self Care | Payer: BLUE CROSS/BLUE SHIELD | Source: Ambulatory Visit | Attending: Internal Medicine | Admitting: Internal Medicine

## 2017-05-03 DIAGNOSIS — E785 Hyperlipidemia, unspecified: Secondary | ICD-10-CM

## 2018-12-04 IMAGING — CT CT HEART SCORING
1 of 2 series · 11 of 20 positions shown, 14 images · non-contrast
Comparison: None.

CLINICAL DATA: 46-year-old male history of hyperlipidemia. Family
history of heart disease (father).

EXAM:
CT HEART FOR CALCIUM SCORING
TECHNIQUE: CT heart was performed on a 64 channel system using prospective ECG
gating.
A non-contrast exam for calcium scoring was performed.
Note that this exam targets the heart and the chest was not imaged
in its entirety.

[Series 2: smartscore - gated 0.4 sec · axial · 0.49mm/px · z∈[-228,-98]mm · 11 of 64 slices shown, 14 images]
[im 6/64  vessel]
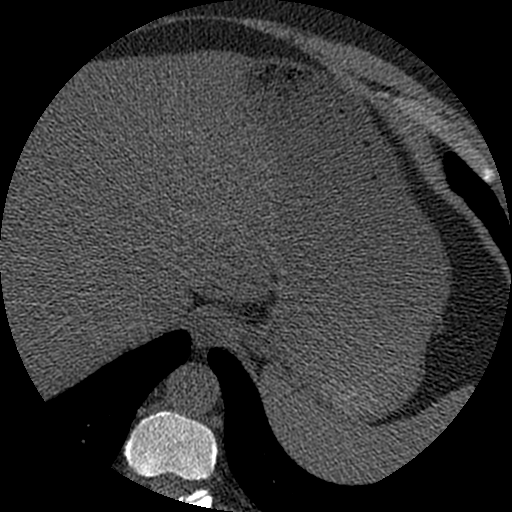
[im 6/64  lung]
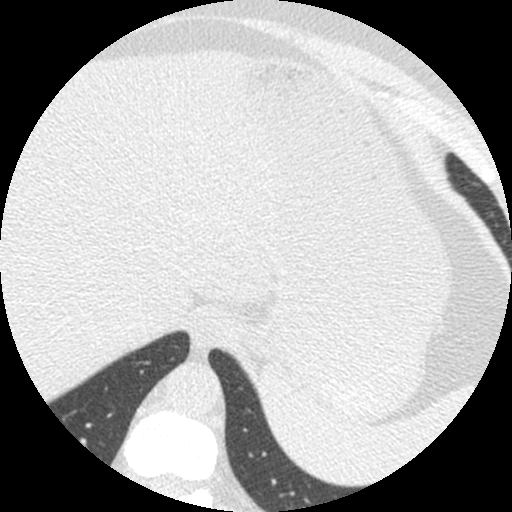
[im 11/64  vessel]
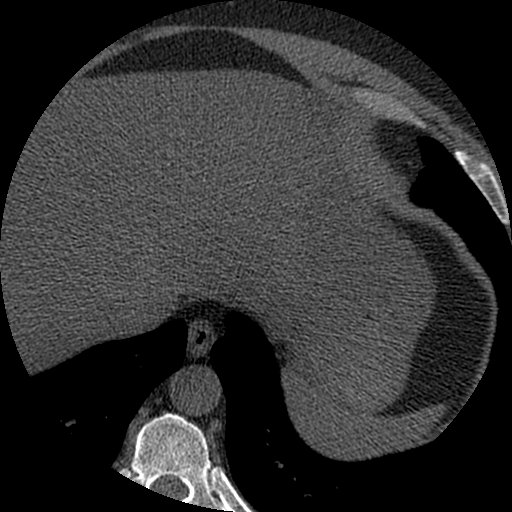
[im 16/64  vessel]
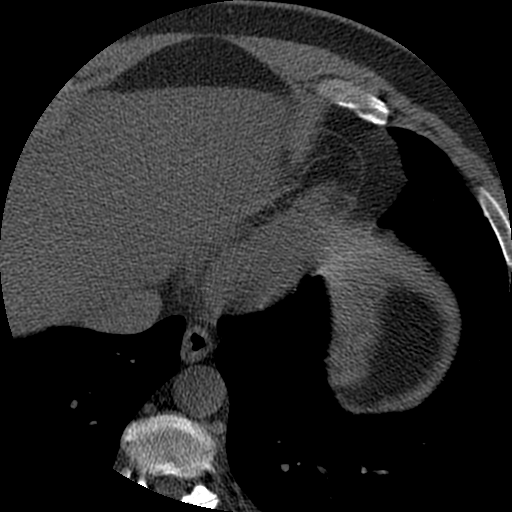
[im 22/64  vessel]
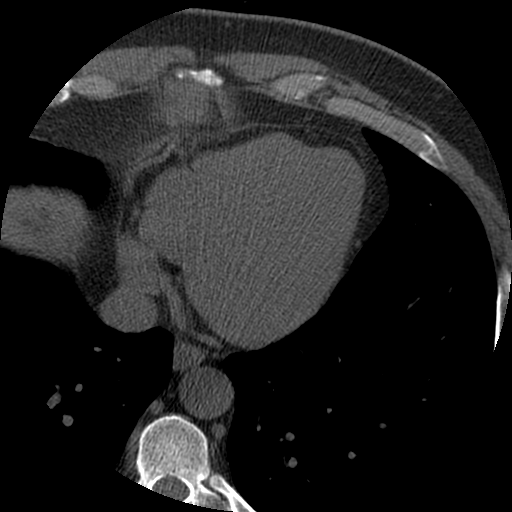
[im 27/64  vessel]
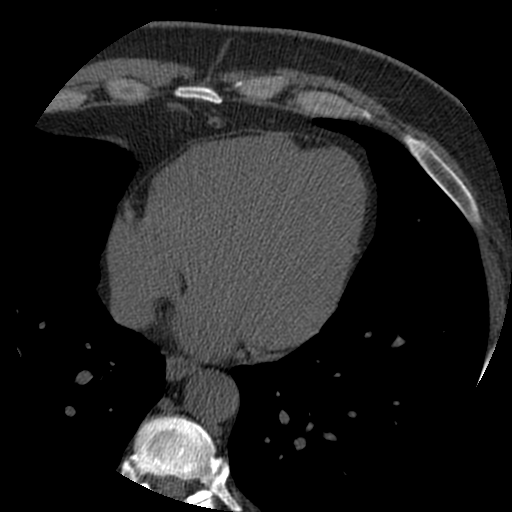
[im 27/64  lung]
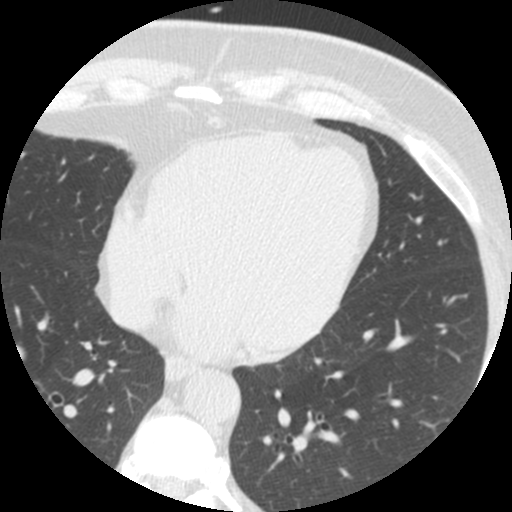
[im 32/64  vessel]
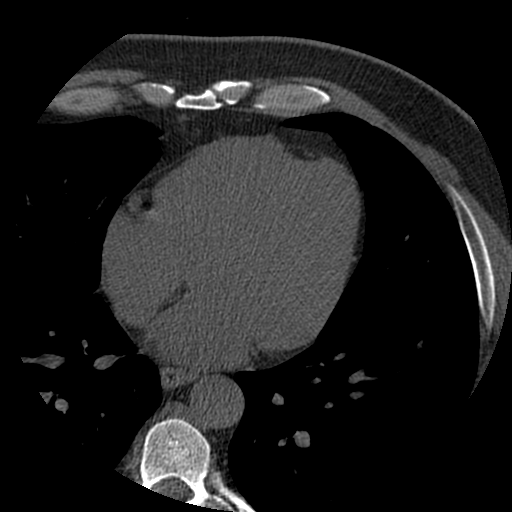
[im 37/64  vessel]
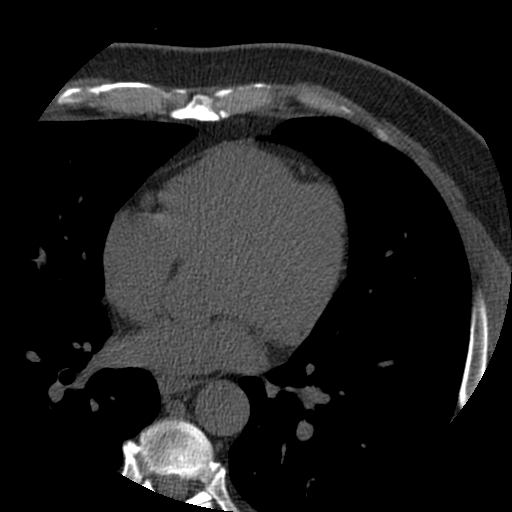
[im 43/64  vessel]
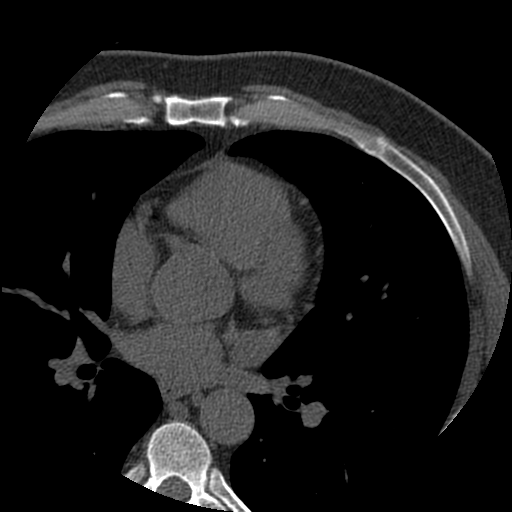
[im 48/64  vessel]
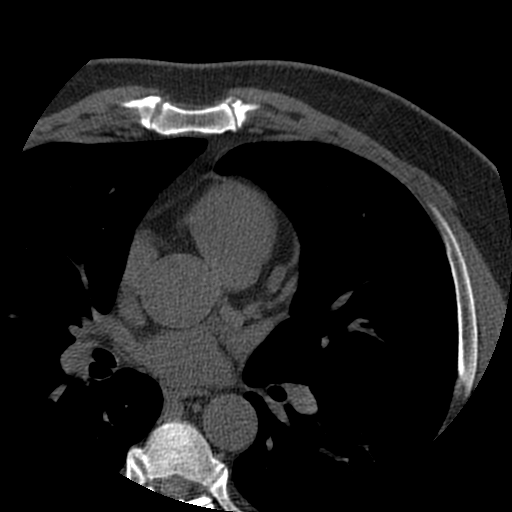
[im 48/64  lung]
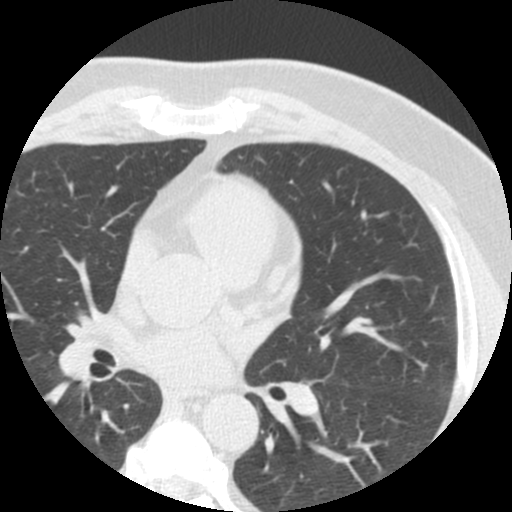
[im 53/64  vessel]
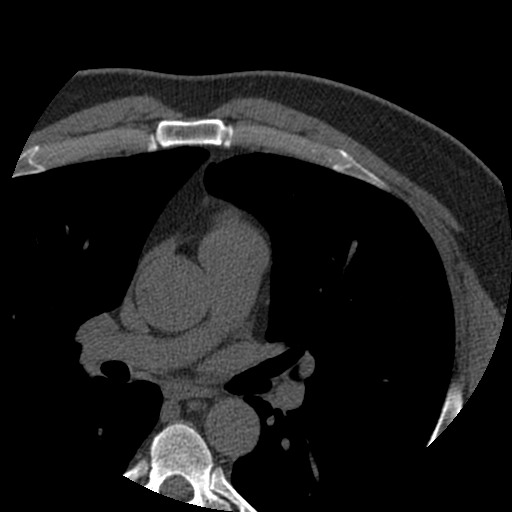
[im 58/64  vessel]
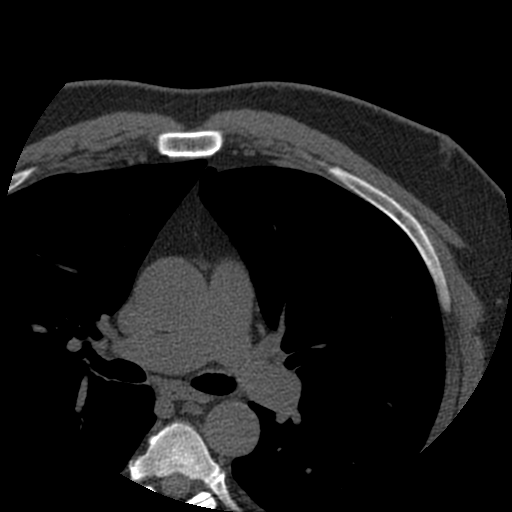

[11 of 20 positions shown; findings below may reference images not displayed]

FINDINGS: Technical quality: Good.

CORONARY CALCIUM

Total Agatston Score: 5

[HOSPITAL] percentile:  76th

OTHER FINDINGS:

Within the visualized portions of the thorax there are no suspicious
appearing pulmonary nodules or masses, there is no acute
consolidative airspace disease, no pleural effusions, no
pneumothorax and no lymphadenopathy. Visualized portions of the
upper abdomen demonstrate diffuse low attenuation throughout the
visualized hepatic parenchyma, indicative of hepatic steatosis.
There are no aggressive appearing lytic or blastic lesions noted in
the visualized portions of the skeleton.
IMPRESSION: 1. Patient's total coronary artery calcium score is 5 which is 76
percentile for patient's of matched age, gender and race/ethnicity.
Please note that although the presence of coronary artery calcium
documents the presence of coronary artery disease, the severity of
this disease and any potential stenosis cannot be assessed on this
noncontrast CT examination. Assessment for potential risk factor
modification, dietary therapy or pharmacologic therapy may be
warranted, if clinically indicated.
2. Hepatic steatosis.

## 2020-02-18 ENCOUNTER — Other Ambulatory Visit: Payer: Self-pay | Admitting: Internal Medicine

## 2020-02-18 DIAGNOSIS — R7989 Other specified abnormal findings of blood chemistry: Secondary | ICD-10-CM

## 2020-02-22 ENCOUNTER — Ambulatory Visit
Admission: RE | Admit: 2020-02-22 | Discharge: 2020-02-22 | Disposition: A | Discharge status: Home / Self Care | Payer: 59 | Source: Ambulatory Visit | Attending: Internal Medicine | Admitting: Internal Medicine

## 2020-02-22 DIAGNOSIS — R7989 Other specified abnormal findings of blood chemistry: Secondary | ICD-10-CM

## 2020-02-22 DIAGNOSIS — R945 Abnormal results of liver function studies: Secondary | ICD-10-CM

## 2020-03-13 ENCOUNTER — Encounter: Payer: Self-pay | Admitting: Gastroenterology

## 2020-04-24 ENCOUNTER — Ambulatory Visit (AMBULATORY_SURGERY_CENTER): Payer: Self-pay | Admitting: *Deleted

## 2020-04-24 ENCOUNTER — Other Ambulatory Visit: Payer: Self-pay

## 2020-04-24 VITALS — Ht 71.75 in | Wt 238.8 lb

## 2020-04-24 DIAGNOSIS — Z1211 Encounter for screening for malignant neoplasm of colon: Secondary | ICD-10-CM

## 2020-04-24 MED ORDER — SUTAB 1479-225-188 MG PO TABS: 1.0000 | ORAL_TABLET | Freq: Once | ORAL | 0 refills | Status: AC

## 2020-04-24 NOTE — Progress Notes (Signed)
covid test 05-06-20 at 4:00 pm  Pt is aware that care partner will wait in the car during procedure; if they feel like they will be too hot or cold to wait in the car; they may wait in the 4 th floor lobby. Patient is aware to bring only one care partner. We want them to wear a mask (we do not have any that we can provide them), practice social distancing, and we will check their temperatures when they get here.  I did remind the patient that their care partner needs to stay in the parking lot the entire time and have a cell phone available, we will call them when the pt is ready for discharge. Patient will wear mask into building.  No trouble moving neck   No egg or soy allergy  No home oxygen use   No medications for weight loss taken  emmi information given  Pt denies constipation issues   Sutab code put into RX and paper copy given to pt to show pharmacy

## 2020-05-06 ENCOUNTER — Other Ambulatory Visit: Payer: Self-pay | Admitting: Gastroenterology

## 2020-05-06 LAB — SARS CORONAVIRUS 2 (TAT 6-24 HRS): SARS Coronavirus 2: NEGATIVE

## 2020-05-08 ENCOUNTER — Other Ambulatory Visit: Payer: Self-pay

## 2020-05-08 ENCOUNTER — Ambulatory Visit (AMBULATORY_SURGERY_CENTER): Payer: 59 | Admitting: Gastroenterology

## 2020-05-08 ENCOUNTER — Encounter: Payer: Self-pay | Admitting: Gastroenterology

## 2020-05-08 VITALS — BP 122/88 | HR 59 | Temp 97.7°F | Resp 14 | Ht 71.0 in | Wt 233.8 lb

## 2020-05-08 DIAGNOSIS — K621 Rectal polyp: Secondary | ICD-10-CM | POA: Diagnosis not present

## 2020-05-08 DIAGNOSIS — D125 Benign neoplasm of sigmoid colon: Secondary | ICD-10-CM | POA: Diagnosis not present

## 2020-05-08 DIAGNOSIS — Z1211 Encounter for screening for malignant neoplasm of colon: Secondary | ICD-10-CM | POA: Diagnosis not present

## 2020-05-08 DIAGNOSIS — D123 Benign neoplasm of transverse colon: Secondary | ICD-10-CM

## 2020-05-08 DIAGNOSIS — D128 Benign neoplasm of rectum: Secondary | ICD-10-CM

## 2020-05-08 MED ORDER — SODIUM CHLORIDE 0.9 % IV SOLN: 500.0000 mL | INTRAVENOUS | Status: DC

## 2020-05-08 NOTE — Progress Notes (Signed)
AR - Check-in  CW - VS  Pt's states no medical or surgical changes since previsit or office visit. 

## 2020-05-08 NOTE — Progress Notes (Signed)
Called to room to assist during endoscopic procedure.  Patient ID and intended procedure confirmed with present staff. Received instructions for my participation in the procedure from the performing physician.  

## 2020-05-08 NOTE — Patient Instructions (Signed)
Handout on polyps given to you today  Await pathology results   YOU HAD AN ENDOSCOPIC PROCEDURE TODAY AT THE [facility]:   Refer to the procedure report that was given to you for any specific questions about what was found during the examination.  If the procedure report does not answer your questions, please call your gastroenterologist to clarify.  If you requested that your care partner not be given the details of your procedure findings, then the procedure report has been included in a sealed envelope for you to review at your convenience later.  YOU SHOULD EXPECT: Some feelings of bloating in the abdomen. Passage of more gas than usual.  Walking can help get rid of the air that was put into your GI tract during the procedure and reduce the bloating. If you had a lower endoscopy (such as a colonoscopy or flexible sigmoidoscopy) you may notice spotting of blood in your stool or on the toilet paper. If you underwent a bowel prep for your procedure, you may not have a normal bowel movement for a few days.  Please Note:  You might notice some irritation and congestion in your nose or some drainage.  This is from the oxygen used during your procedure.  There is no need for concern and it should clear up in a day or so.  SYMPTOMS TO REPORT IMMEDIATELY:   Following lower endoscopy (colonoscopy or flexible sigmoidoscopy):  Excessive amounts of blood in the stool  Significant tenderness or worsening of abdominal pains  Swelling of the abdomen that is new, acute  Fever of 100F or higher  For urgent or emergent issues, a gastroenterologist can be reached at any hour by calling [phone]. Do not use MyChart messaging for urgent concerns.    DIET:  We do recommend a small meal at first, but then you may proceed to your regular diet.  Drink plenty of fluids but you should avoid alcoholic beverages for 24 hours.  ACTIVITY:  You should plan to take it easy for the rest of today and  you should NOT DRIVE or use heavy machinery until tomorrow (because of the sedation medicines used during the test).    FOLLOW UP: Our staff will call the number listed on your records 48-72 hours following your procedure to check on you and address any questions or concerns that you may have regarding the information given to you following your procedure. If we do not reach you, we will leave a message.  We will attempt to reach you two times.  During this call, we will ask if you have developed any symptoms of COVID 19. If you develop any symptoms (ie: fever, flu-like symptoms, shortness of breath, cough etc.) before then, please call [phone].  If you test positive for Covid 19 in the 2 weeks post procedure, please call and report this information to us.    If any biopsies were taken you will be contacted by phone or by letter within the next 1-3 weeks.  Please call us at [phone] if you have not heard about the biopsies in 3 weeks.    SIGNATURES/CONFIDENTIALITY: You and/or your care partner have signed paperwork which will be entered into your electronic medical record.  These signatures attest to the fact that that the information above on your After Visit Summary has been reviewed and is understood.  Full responsibility of the confidentiality of this discharge information lies with you and/or your care-partner. 

## 2020-05-08 NOTE — Op Note (Signed)
[facility] Patient Name: Dennis Velasquez Procedure Date: 05/08/2020 9:34 AM MRN: [id number] Endoscopist: Mauri Pole , MD Age: 49 Referring MD:  Date of Birth: May 06, 1971 Gender: Male Account #: [id number] Procedure:                Colonoscopy Indications:              Screening for colorectal malignant neoplasm Medicines:                Monitored Anesthesia Care Procedure:                Pre-Anesthesia Assessment:                           - Prior to the procedure, a History and Physical                            was performed, and patient medications and                            allergies were reviewed. The patient's tolerance of                            previous anesthesia was also reviewed. The risks                            and benefits of the procedure and the sedation                            options and risks were discussed with the patient.                            All questions were answered, and informed consent                            was obtained. Prior Anticoagulants: The patient has                            taken no previous anticoagulant or antiplatelet                            agents. ASA Grade Assessment: II - A patient with                            mild systemic disease. After reviewing the risks                            and benefits, the patient was deemed in                            satisfactory condition to undergo the procedure.                           After obtaining informed consent, the colonoscope  was passed under direct vision. Throughout the                            procedure, the patient's blood pressure, pulse, and                            oxygen saturations were monitored continuously. The                            Colonoscope was introduced through the anus and                            advanced to the the cecum, identified by                            appendiceal orifice and  ileocecal valve. The                            colonoscopy was performed without difficulty. The                            patient tolerated the procedure well. The quality                            of the bowel preparation was good. The ileocecal                            valve, appendiceal orifice, and rectum were                            photographed. Scope In: 9:44:09 AM Scope Out: 10:00:15 AM Scope Withdrawal Time: 0 hours 14 minutes 36 seconds  Total Procedure Duration: 0 hours 16 minutes 6 seconds  Findings:                 The perianal and digital rectal examinations were                            normal.                           Two sessile polyps were found in the sigmoid colon                            and transverse colon. The polyps were 4 to 6 mm in                            size. These polyps were removed with a cold snare.                            Resection and retrieval were complete.                           Two sessile polyps were found in the sigmoid colon.  The polyps were 1 to 2 mm in size. These polyps                            were removed with a cold biopsy forceps. Resection                            and retrieval were complete.                           Two semi-pedunculated polyps were found in the                            rectum. The polyps were 6 to 14 mm in size. These                            polyps were removed with a hot snare. Resection and                            retrieval were complete.                           Non-bleeding internal hemorrhoids were found during                            retroflexion. The hemorrhoids were medium-sized. Complications:            No immediate complications. Estimated Blood Loss:     Estimated blood loss was minimal. Impression:               - Two 4 to 6 mm polyps in the sigmoid colon and in                            the transverse colon, removed with a cold snare.                             Resected and retrieved.                           - Two 1 to 2 mm polyps in the sigmoid colon,                            removed with a cold biopsy forceps. Resected and                            retrieved.                           - Two 6 to 14 mm polyps in the rectum, removed with                            a hot snare. Resected and retrieved.                           - Non-bleeding internal hemorrhoids.  Recommendation:           - Patient has a contact number available for                            emergencies. The signs and symptoms of potential                            delayed complications were discussed with the                            patient. Return to normal activities tomorrow.                            Written discharge instructions were provided to the                            patient.                           - Resume previous diet.                           - Continue present medications.                           - Await pathology results.                           - Repeat colonoscopy in 3 years for surveillance                            based on pathology results. Mauri Pole, MD 05/08/2020 10:12:07 AM This report has been signed electronically.

## 2020-05-08 NOTE — Progress Notes (Signed)
Report to PACU, RN, vss, BBS= Clear.  

## 2020-05-12 ENCOUNTER — Telehealth: Payer: Self-pay

## 2020-05-12 ENCOUNTER — Telehealth: Payer: Self-pay | Admitting: *Deleted

## 2020-05-12 NOTE — Telephone Encounter (Signed)
Second follow up call made. Left message.

## 2020-05-12 NOTE — Telephone Encounter (Signed)
First attempt follow up call to pt, lm on vm 

## 2020-05-26 ENCOUNTER — Encounter: Payer: Self-pay | Admitting: Gastroenterology

## 2020-06-09 ENCOUNTER — Telehealth: Payer: Self-pay

## 2020-06-09 NOTE — Telephone Encounter (Signed)
-----   Message from Mauri Pole, MD sent at 06/04/2020  2:12 PM EST ----- Office visit soon with me or APP for abnormal LFT. Thanks

## 2020-06-09 NOTE — Telephone Encounter (Signed)
Called the patient. No answer left a message asking for a return call to confirm or reschedule an appointment for 06/20/20 at 2:00 pm.

## 2020-06-11 NOTE — Telephone Encounter (Signed)
Called the listed number for the patient. No answer.  Left a message about the reason for the call and requested a call back.

## 2020-06-12 NOTE — Telephone Encounter (Signed)
Ok, please inform Dr [facility] Police office. Thanks

## 2020-06-12 NOTE — Telephone Encounter (Signed)
Called the patient's number x 2. No answer. Left a message asking he call us to schedule an appointment. The 06/20/20 appointment will be released because we are unable to confirm with him. Called the phone number listed for his father. No answer. No voicemail.

## 2020-06-12 NOTE — Telephone Encounter (Signed)
Bern. Left a message for Caryl Pina informing her the patient has not responded to our attempts to schedule him an appointment.

## 2020-06-19 ENCOUNTER — Ambulatory Visit: Payer: 59 | Admitting: Physician Assistant

## 2020-06-19 ENCOUNTER — Encounter: Payer: Self-pay | Admitting: Physician Assistant

## 2020-06-19 ENCOUNTER — Other Ambulatory Visit (INDEPENDENT_AMBULATORY_CARE_PROVIDER_SITE_OTHER): Payer: 59

## 2020-06-19 VITALS — BP 164/94 | HR 67 | Ht 71.0 in | Wt 241.4 lb

## 2020-06-19 DIAGNOSIS — R7989 Other specified abnormal findings of blood chemistry: Secondary | ICD-10-CM

## 2020-06-19 LAB — IBC + FERRITIN
Ferritin: 788.9 ng/mL — ABNORMAL HIGH (ref 22.0–322.0)
Iron: 94 ug/dL (ref 42–165)
Saturation Ratios: 23.1 % (ref 20.0–50.0)
Transferrin: 291 mg/dL (ref 212.0–360.0)

## 2020-06-19 LAB — CBC WITH DIFFERENTIAL/PLATELET
Basophils Absolute: 0.1 10*3/uL (ref 0.0–0.1)
Basophils Relative: 1.3 % (ref 0.0–3.0)
Eosinophils Absolute: 0.6 10*3/uL (ref 0.0–0.7)
Eosinophils Relative: 7.6 % — ABNORMAL HIGH (ref 0.0–5.0)
HCT: 47.1 % (ref 39.0–52.0)
Hemoglobin: 16.2 g/dL (ref 13.0–17.0)
Lymphocytes Relative: 35.3 % (ref 12.0–46.0)
Lymphs Abs: 2.6 10*3/uL (ref 0.7–4.0)
MCHC: 34.4 g/dL (ref 30.0–36.0)
MCV: 92.7 fl (ref 78.0–100.0)
Monocytes Absolute: 0.5 10*3/uL (ref 0.1–1.0)
Monocytes Relative: 6.5 % (ref 3.0–12.0)
Neutro Abs: 3.6 10*3/uL (ref 1.4–7.7)
Neutrophils Relative %: 49.3 % (ref 43.0–77.0)
Platelets: 155 10*3/uL (ref 150.0–400.0)
RBC: 5.08 Mil/uL (ref 4.22–5.81)
RDW: 12.5 % (ref 11.5–15.5)
WBC: 7.3 10*3/uL (ref 4.0–10.5)

## 2020-06-19 LAB — COMPREHENSIVE METABOLIC PANEL
ALT: 410 U/L — ABNORMAL HIGH (ref 0–53)
AST: 168 U/L — ABNORMAL HIGH (ref 0–37)
Albumin: 4.2 g/dL (ref 3.5–5.2)
Alkaline Phosphatase: 53 U/L (ref 39–117)
BUN: 9 mg/dL (ref 6–23)
CO2: 26 mEq/L (ref 19–32)
Calcium: 9.1 mg/dL (ref 8.4–10.5)
Chloride: 102 mEq/L (ref 96–112)
Creatinine, Ser: 0.73 mg/dL (ref 0.40–1.50)
GFR: 107.02 mL/min (ref >60.00)
Glucose, Bld: 139 mg/dL — ABNORMAL HIGH (ref 70–99)
Potassium: 4 mEq/L (ref 3.5–5.1)
Sodium: 137 mEq/L (ref 135–145)
Total Bilirubin: 0.7 mg/dL (ref 0.2–1.2)
Total Protein: 7.4 g/dL (ref 6.0–8.3)

## 2020-06-19 LAB — PROTIME-INR
INR: 1 ratio (ref 0.8–1.0)
Prothrombin Time: 10.7 s (ref 9.6–13.1)

## 2020-06-19 NOTE — Patient Instructions (Signed)
If you are age 49 or older, your body mass index should be between 23-30. Your Body mass index is 33.66 kg/m. If this is out of the aforementioned range listed, please consider follow up with your Primary Care Provider.  If you are age 71 or younger, your body mass index should be between 19-25. Your Body mass index is 33.66 kg/m. If this is out of the aformentioned range listed, please consider follow up with your Primary Care Provider.   Your provider has requested that you go to the basement level for lab work before leaving today. Press "B" on the elevator. The lab is located at the first door on the left as you exit the elevator.  Thank you for choosing me and [facility].  Ellouise Newer, PA-C

## 2020-06-19 NOTE — Progress Notes (Addendum)
Chief Complaint: Elevated LFTs  HPI:    Dennis Velasquez is a 49 year old male, known to Dr. Silverio Decamp with a past medical history of diabetes and others listed below, who was referred to me by Shon Baton, MD for a complaint of elevated LFTs.      04/22/2015 CMP with an ALT elevated at 71.  03/26/2015 CMP with an ALT elevated at 99 and AST elevated at 38.    02/22/2020 ultrasound of the abdomen with increased liver echogenicity which may reflect steatosis and subcentimeter gallbladder polyp.    Colonoscopy 05/08/2020 with 6 polyps of mixture of hyperplastic and tubular adenomas and nonbleeding internal hemorrhoids.  Repeat recommended in 5 years.    Today, the patient tells me he has had labs drawn about 3 times this year, we do not have any of these labs at time of his visit.  He explains that his LFTs have been "hovering" and are elevated.  Describes that he has taken in some time to get control of his glucose but he seems to have done that most recently and he is also started exercising.  Denies any GI complaints or concerns.    Denies fever, chills, weight loss, abdominal pain, nausea, vomiting, heartburn, reflux, family history of liver disease, history of hepatitis, history of IV drug use or history of tattoos  Past Medical History:  Diagnosis Date  . Diabetes mellitus type 2, uncontrolled, without complications 25/63/8937  . Dyslipidemia 03/20/2016  . History of chicken pox   . History of colon polyps ~2010   benign  . History of kidney stones 1996  . Hypertension   . Kidney stone   . Obesity, Class I, BMI 30-34.9 03/21/2015  . Psoriasis     Past Surgical History:  Procedure Laterality Date  . COLONOSCOPY  2010   fmhx, colon polyps rec rpt 5 yrs [facility])    Current Outpatient Medications  Medication Sig Dispense Refill  . hydrochlorothiazide (HYDRODIURIL) 12.5 MG tablet Take 12.5 mg by mouth every morning.    Marland Kitchen losartan (COZAAR) 100 MG tablet Take 100 mg by mouth daily.    . metFORMIN  (GLUCOPHAGE) 500 MG tablet Take one daily for 1 week then increase to twice daily with meals 60 tablet 3   No current facility-administered medications for this visit.    Allergies as of 06/19/2020  . (No Known Allergies)    Family History  Problem Relation Age of Onset  . Cancer Maternal Grandmother        colon  . Colon cancer Maternal Grandmother   . Stroke Mother 11       subarachnoid hemorrhage  . Colon polyps Mother   . Diabetes Father   . Stroke Father        mini  . Hypertension Sister   . Colon polyps Sister   . CAD Neg Hx   . Esophageal cancer Neg Hx   . Rectal cancer Neg Hx   . Stomach cancer Neg Hx     Social History   Socioeconomic History  . Marital status: Married    Spouse name: Not on file  . Number of children: Not on file  . Years of education: Not on file  . Highest education level: Not on file  Occupational History  . Not on file  Tobacco Use  . Smoking status: Former Smoker    Packs/day: 0.50    Types: Cigarettes    Start date: 07/12/1984    Quit date: 09/10/2014  Years since quitting: 5.7  . Smokeless tobacco: Never Used  . Tobacco comment: currently vapes  Vaping Use  . Vaping Use: Former  Substance and Sexual Activity  . Alcohol use: Yes    Alcohol/week: 0.0 standard drinks    Comment: Occasional  . Drug use: No  . Sexual activity: Not on file  Other Topics Concern  . Not on file  Social History Narrative   Lives with wife and son   Occ: Real Estate investment/development   Edu: college   Activity: some swimming   Diet: good water, fruits/vegetables daily   Social Determinants of Health   Financial Resource Strain: Not on file  Food Insecurity: Not on file  Transportation Needs: Not on file  Physical Activity: Not on file  Stress: Not on file  Social Connections: Not on file  Intimate Partner Violence: Not on file    Review of Systems:    Constitutional: No weight loss, fever or chills Cardiovascular: No chest  pain Respiratory: No SOB  Gastrointestinal: See HPI and otherwise negative   Physical Exam:  Vital signs: BP (!) 164/94 (BP Location: Left Arm, Patient Position: Sitting, Cuff Size: Large)   Pulse 67   Ht 5' 11"  (1.803 m)   Wt 241 lb 6 oz (109.5 kg)   SpO2 99%   BMI 33.66 kg/m   Constitutional:   Pleasant overweight Caucasian male appears to be in NAD, Well developed, Well nourished, alert and cooperative Respiratory: Respirations even and unlabored. Lungs clear to auscultation bilaterally.   No wheezes, crackles, or rhonchi.  Cardiovascular: Normal S1, S2. No MRG. Regular rate and rhythm. No peripheral edema, cyanosis or pallor.  Gastrointestinal:  Soft, nondistended, nontender. No rebound or guarding. Normal bowel sounds. No appreciable masses or hepatomegaly. Rectal:  Not performed.  Psychiatric: Demonstrates good judgement and reason without abnormal affect or behaviors.  Requesting recent labs.  Assessment: 1.  Elevated LFTs: We do not have most recent labs, we are requesting them but LFTs been elevated since 2016, patient does have fatty liver on ultrasound which is the likely cause 2.  Fatty liver: Seen on recent abdominal ultrasound  Plan: 1.  Ordered further labs to include a CBC, CMP, iron studies with ferritin, PT/INR, AMA, ASMA, ANA, alpha 1 antitrypsin, ceruloplasmin and hepatitis studies 2.  Encouraged patient to start a slow and steady weight loss of 1 to 2 pounds per week.  Described fatty liver and how this can be reversed. 3.  Requesting patient's most recent labs. 4.  Patient to follow in clinic per recommendations after labs above  Dennis Newer, PA-C [facility] 06/19/2020, 9:01 AM  Cc: Shon Baton, MD   Addendum: 06/19/2020 2:45 PM  Received recent labs.  05/29/2020 CMP with an elevated glucose at 168 and AST of 144 and ALT of 410 and otherwise normal.  03/13/2020 AST 173, ALT 341 and normal alk phos.  Total bilirubin 1.7, direct bilirubin  0.4.  No change in plans.  JLL

## 2020-06-20 ENCOUNTER — Ambulatory Visit: Payer: 59 | Admitting: Nurse Practitioner

## 2020-06-20 LAB — IGA: Immunoglobulin A: 391 mg/dL — ABNORMAL HIGH (ref 47–310)

## 2020-06-20 NOTE — Progress Notes (Signed)
Reviewed and agree with documentation and assessment and plan. K. Veena Perseus Westall , MD   

## 2020-06-22 LAB — HEPATITIS PANEL, ACUTE
Hep A IgM: NONREACTIVE
Hep B C IgM: NONREACTIVE
Hepatitis B Surface Ag: NONREACTIVE
Hepatitis C Ab: NONREACTIVE
SIGNAL TO CUT-OFF: 0.01 (ref <1.00)

## 2020-06-22 LAB — MITOCHONDRIAL ANTIBODIES: Mitochondrial M2 Ab, IgG: <=20 U

## 2020-06-22 LAB — TISSUE TRANSGLUTAMINASE, IGA: (tTG) Ab, IgA: <1 U/mL

## 2020-06-22 LAB — ANA: Anti Nuclear Antibody (ANA): NEGATIVE

## 2020-06-22 LAB — IRON, TOTAL/TOTAL IRON BINDING CAP
%SAT: 25 % (calc) (ref 20–48)
Iron: 95 ug/dL (ref 50–180)
TIBC: 386 mcg/dL (calc) (ref 250–425)

## 2020-06-22 LAB — ANTI-SMOOTH MUSCLE ANTIBODY, IGG: Actin (Smooth Muscle) Antibody (IGG): <20 U (ref <20)

## 2020-06-22 LAB — CERULOPLASMIN: Ceruloplasmin: 27 mg/dL (ref 18–36)

## 2020-06-22 LAB — ALPHA-1-ANTITRYPSIN: A-1 Antitrypsin, Ser: 190 mg/dL (ref 83–199)

## 2021-09-24 IMAGING — US US ABDOMEN COMPLETE
1 series · 14 of 25 positions shown · non-contrast
Comparison: None.

CLINICAL DATA: Abnormal liver function test

EXAM:
ABDOMEN ULTRASOUND COMPLETE

[Series 1: us abdomen complete · 0.25mm/px · 14 of 99 slices shown]
[im 1/99]
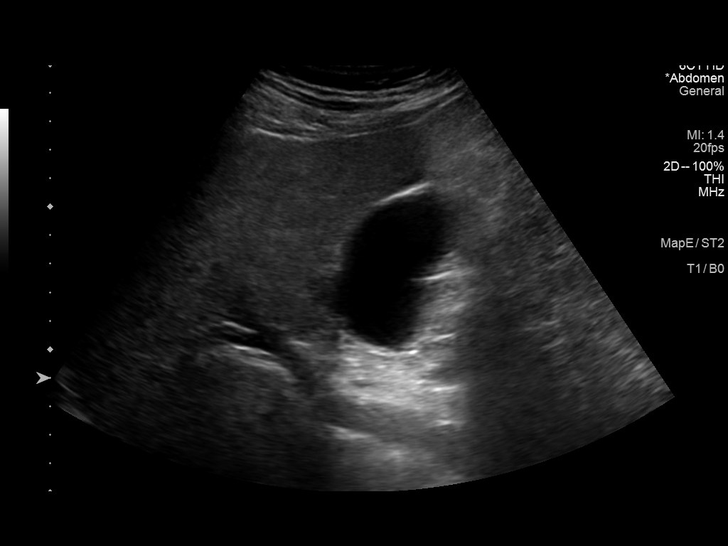
[im 9/99]
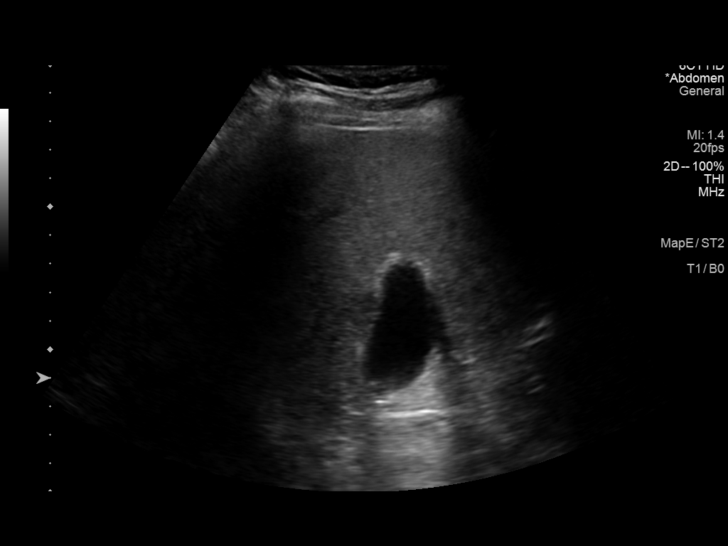
[im 17/99]
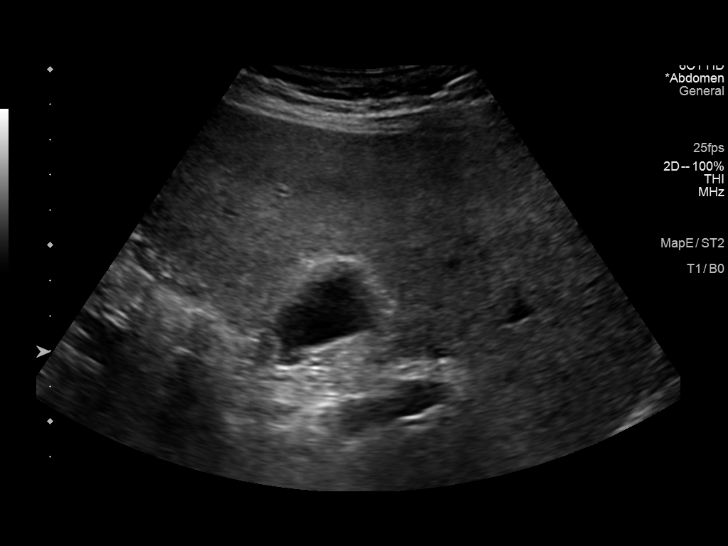
[im 25/99]
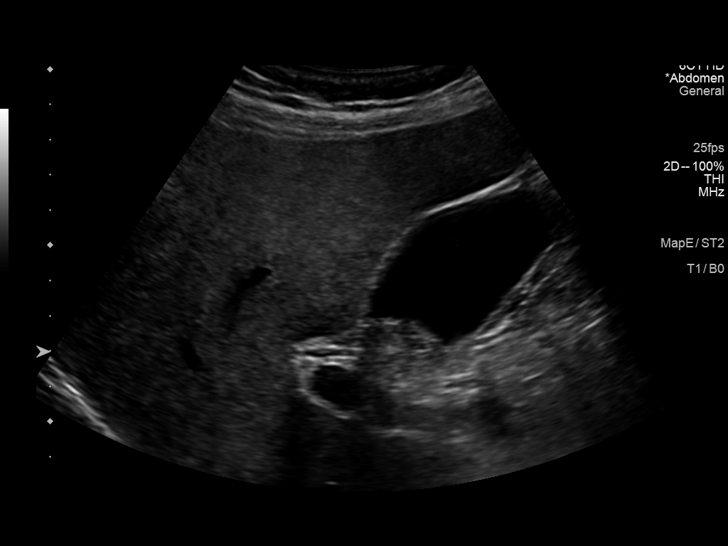
[im 33/99]
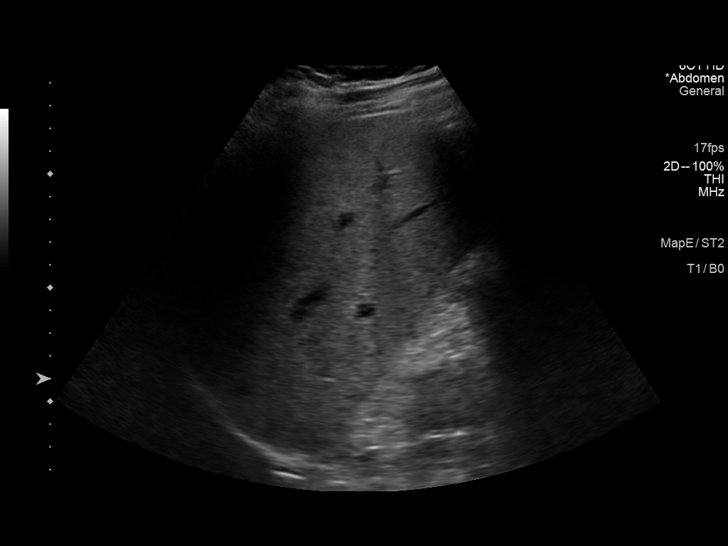
[im 37/99]
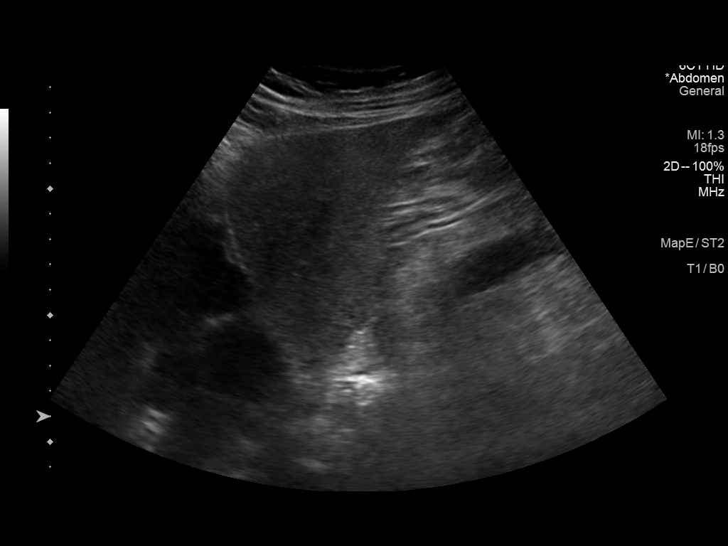
[im 45/99]
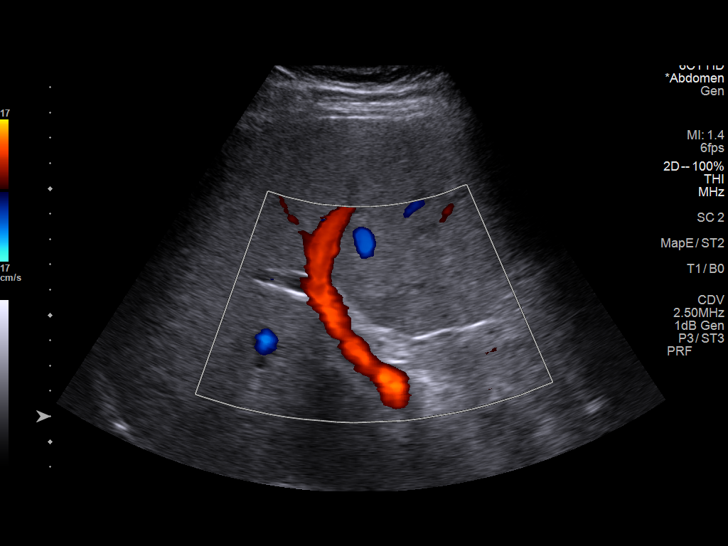
[im 54/99]
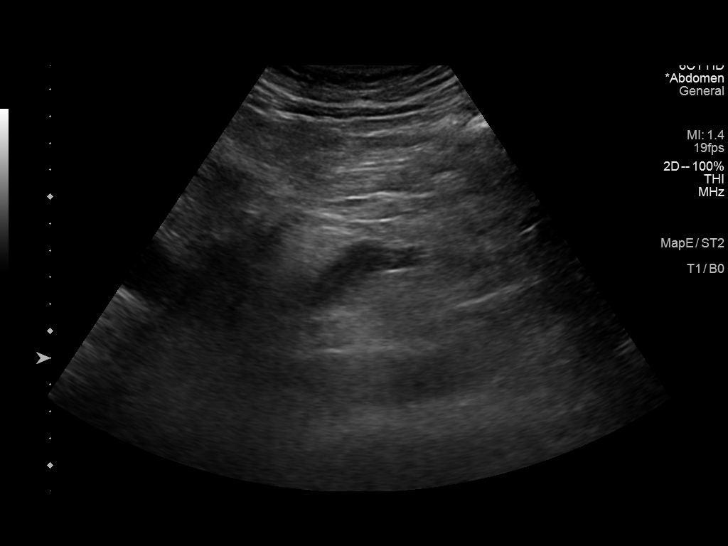
[im 62/99]
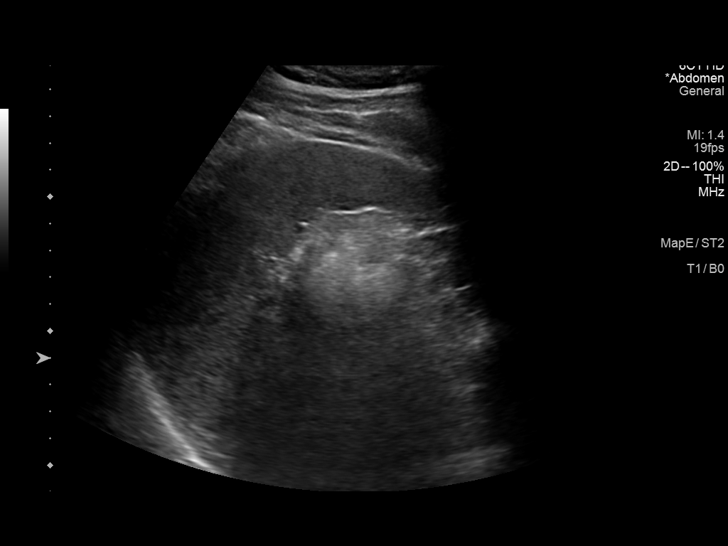
[im 66/99]
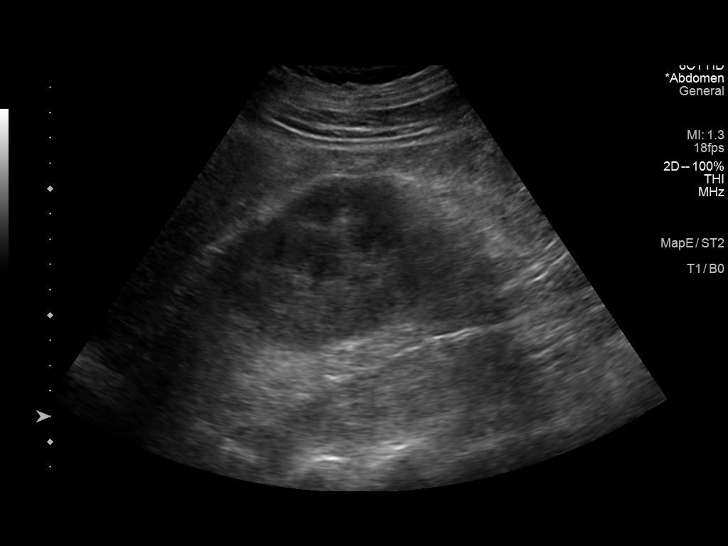
[im 74/99]
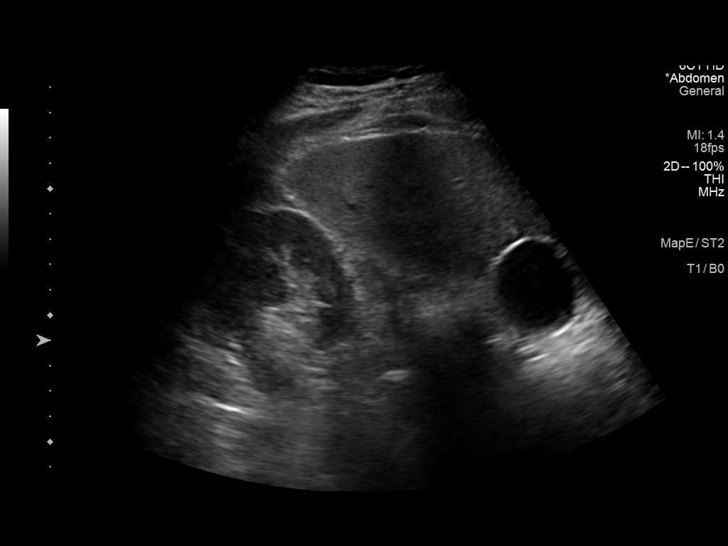
[im 82/99]
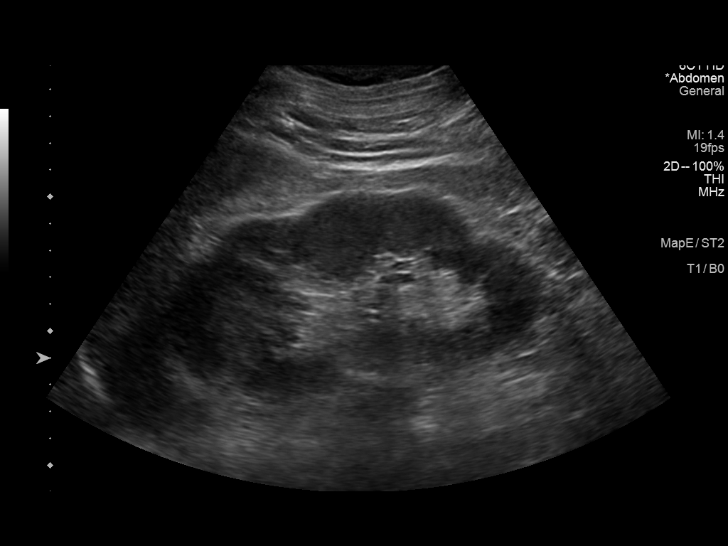
[im 90/99]
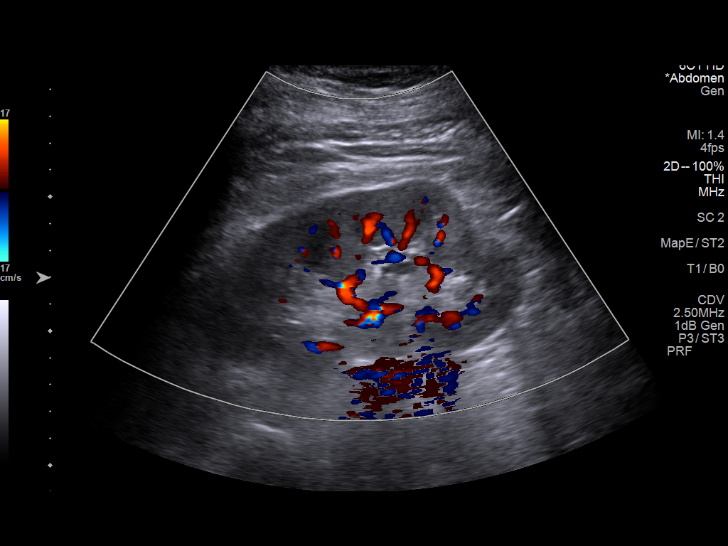
[im 99/99]
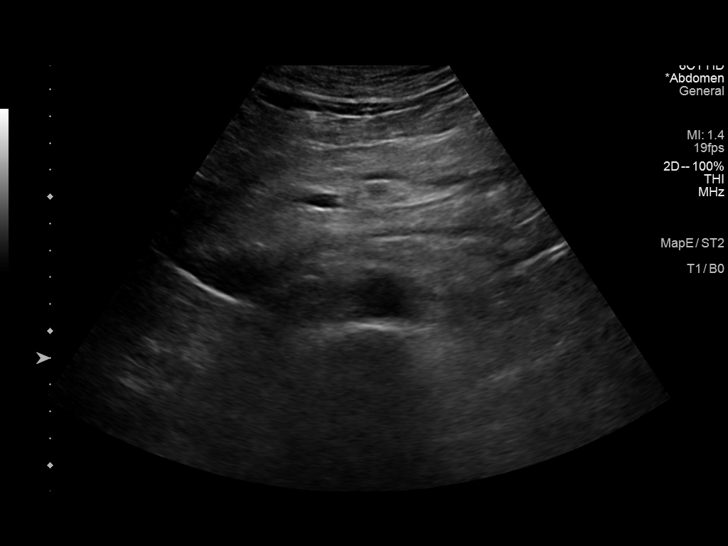

[14 of 25 positions shown; findings below may reference images not displayed]

FINDINGS: Gallbladder: No gallstones or wall thickening visualized. There is a
5 mm polyp. No sonographic Murphy sign noted by sonographer.

Common bile duct: Diameter: 2 mm, though poorly visualized

Liver: No focal lesion identified. Increased parenchymal
echogenicity. Portal vein is patent on color Doppler imaging with
normal direction of blood flow towards the liver.

IVC: Limited visualization.

Pancreas: Limited visualization.

Spleen: Size and appearance within normal limits.

Right Kidney: Length: 14.7 cm. Echogenicity within normal limits. No
mass or hydronephrosis visualized.

Left Kidney: Length: 14.4 cm. Echogenicity within normal limits. No
mass or hydronephrosis visualized.

Abdominal aorta: No aneurysm visualized.

Other findings: None.
IMPRESSION: Increased liver echogenicity, which may reflect steatosis.

Subcentimeter gallbladder polyp.
